# Patient Record
Sex: Female | Born: 1980 | Race: Black or African American | Hispanic: No | Marital: Married | Smoking: Never smoker
Health system: Southern US, Community
[De-identification: ages and names within clinical notes are randomized; demographics above are authoritative.]

## PROBLEM LIST (undated history)

## (undated) ENCOUNTER — Inpatient Hospital Stay (HOSPITAL_COMMUNITY): Payer: Self-pay

## (undated) HISTORY — PX: APPENDECTOMY: SHX54

---

## 2014-03-05 ENCOUNTER — Inpatient Hospital Stay (HOSPITAL_COMMUNITY)
Admission: AD | Admit: 2014-03-05 | Discharge: 2014-03-05 | Discharge status: Against Medical Advice | Payer: Medicaid Other | Source: Ambulatory Visit | Attending: Obstetrics & Gynecology | Admitting: Obstetrics & Gynecology

## 2014-03-05 ENCOUNTER — Encounter (HOSPITAL_COMMUNITY): Payer: Self-pay

## 2014-03-05 DIAGNOSIS — O210 Mild hyperemesis gravidarum: Secondary | ICD-10-CM | POA: Insufficient documentation

## 2014-03-05 DIAGNOSIS — R109 Unspecified abdominal pain: Secondary | ICD-10-CM | POA: Insufficient documentation

## 2014-03-05 DIAGNOSIS — O9989 Other specified diseases and conditions complicating pregnancy, childbirth and the puerperium: Principal | ICD-10-CM

## 2014-03-05 DIAGNOSIS — K5900 Constipation, unspecified: Secondary | ICD-10-CM | POA: Insufficient documentation

## 2014-03-05 DIAGNOSIS — R51 Headache: Secondary | ICD-10-CM | POA: Insufficient documentation

## 2014-03-05 DIAGNOSIS — O99891 Other specified diseases and conditions complicating pregnancy: Secondary | ICD-10-CM | POA: Insufficient documentation

## 2014-03-05 LAB — URINALYSIS, ROUTINE W REFLEX MICROSCOPIC
Bilirubin Urine: NEGATIVE
Glucose, UA: NEGATIVE mg/dL
Hgb urine dipstick: NEGATIVE
KETONES UR: NEGATIVE mg/dL
LEUKOCYTES UA: NEGATIVE
NITRITE: NEGATIVE
PH: 6 (ref 5.0–8.0)
PROTEIN: NEGATIVE mg/dL
Specific Gravity, Urine: 1.015 (ref 1.005–1.030)
Urobilinogen, UA: 0.2 mg/dL (ref 0.0–1.0)

## 2014-03-05 LAB — WET PREP, GENITAL
Clue Cells Wet Prep HPF POC: NONE SEEN
Trich, Wet Prep: NONE SEEN
Yeast Wet Prep HPF POC: NONE SEEN

## 2014-03-05 LAB — COMPREHENSIVE METABOLIC PANEL
ALT: 20 U/L (ref 0–35)
AST: 18 U/L (ref 0–37)
Albumin: 2.9 g/dL — ABNORMAL LOW (ref 3.5–5.2)
Alkaline Phosphatase: 30 U/L — ABNORMAL LOW (ref 39–117)
BUN: 7 mg/dL (ref 6–23)
CHLORIDE: 100 meq/L (ref 96–112)
CO2: 24 meq/L (ref 19–32)
Calcium: 9.6 mg/dL (ref 8.4–10.5)
Creatinine, Ser: 0.55 mg/dL (ref 0.50–1.10)
GFR calc Af Amer: >90 mL/min (ref >90)
Glucose, Bld: 78 mg/dL (ref 70–99)
Potassium: 4.6 mEq/L (ref 3.7–5.3)
Sodium: 134 mEq/L — ABNORMAL LOW (ref 137–147)
Total Protein: 7 g/dL (ref 6.0–8.3)

## 2014-03-05 LAB — CBC
HEMATOCRIT: 35.6 % — AB (ref 36.0–46.0)
HEMOGLOBIN: 12.3 g/dL (ref 12.0–15.0)
MCH: 32.4 pg (ref 26.0–34.0)
MCHC: 34.6 g/dL (ref 30.0–36.0)
MCV: 93.7 fL (ref 78.0–100.0)
Platelets: 152 10*3/uL (ref 150–400)
RBC: 3.8 MIL/uL — AB (ref 3.87–5.11)
RDW: 14.3 % (ref 11.5–15.5)
WBC: 7.6 10*3/uL (ref 4.0–10.5)

## 2014-03-05 LAB — LIPASE, BLOOD: Lipase: 27 U/L (ref 11–59)

## 2014-03-05 LAB — AMYLASE: AMYLASE: 84 U/L (ref 0–105)

## 2014-03-05 MED ORDER — PROMETHAZINE HCL 25 MG PO TABS
25.0000 mg | ORAL_TABLET | Freq: Once | ORAL | Status: AC
  Administered 2014-03-05: 25 mg via ORAL
  Filled 2014-03-05: qty 1

## 2014-03-05 MED ORDER — GI COCKTAIL ~~LOC~~
30.0000 mL | Freq: Once | ORAL | Status: AC
  Administered 2014-03-05: 30 mL via ORAL
  Filled 2014-03-05: qty 30

## 2014-03-05 NOTE — MAU Note (Signed)
Patient states she has not had prenatal care. Went to a general [facility] and was told she was pregnant and her due date is 10-28 by her LMP. Has been having abdominal pain off and on but has been constant since this am. Denies bleeding or discharge. States she has felt fetal movement.

## 2014-03-05 NOTE — MAU Provider Note (Signed)
History     CSN: [id number]  Arrival date and time: 03/05/14 1654   First Provider Initiated Contact with Patient 03/05/14 1855      Chief Complaint  Patient presents with  . Abdominal Pain   Abdominal Pain Associated symptoms include constipation, headaches (one headache today), nausea and vomiting. Pertinent negatives include no diarrhea, dysuria or fever.   This is a 33 y.o. female at [redacted]w[redacted]d who presents with c/o abdominal pain for weeks, worsened today. Pain is all over abdomen then focuses on suprapubic area. No leaking or bleeding.  Has vomiting once a day, sometimes has blood in it. C/O constipation, last BM yesterday. Denies fever or diarrhea.   Has appointment in July with Dr Ruthann Cancer for prenatal care.   RN Note: Patient states she has not had prenatal care. Went to a general [facility] and was told she was pregnant and her due date is 10-28 by her LMP. Has been having abdominal pain off and on but has been constant since this am. Denies bleeding or discharge. States she has felt fetal movement.        OB History   Grav Para Term Preterm Abortions TAB SAB Ect Mult Living   2 1 1       1       History reviewed. No pertinent past medical history.  Past Surgical History  Procedure Laterality Date  . Appendectomy      History reviewed. No pertinent family history.  History  Substance Use Topics  . Smoking status: Never Smoker   . Smokeless tobacco: Not on file  . Alcohol Use: No    Allergies: No Known Allergies  Prescriptions prior to admission  Medication Sig Dispense Refill  . acetaminophen (TYLENOL) 500 MG tablet Take 1,000 mg by mouth every 6 (six) hours as needed for moderate pain.      . Prenatal Vit-Fe Fumarate-FA (PRENATAL MULTIVITAMIN) TABS tablet Take 1 tablet by mouth daily at 12 noon.        Review of Systems  Constitutional: Negative for fever and chills.  Gastrointestinal: Positive for nausea, vomiting, abdominal pain and constipation.  Negative for diarrhea.  Genitourinary: Negative for dysuria.  Musculoskeletal: Negative for back pain.  Neurological: Positive for headaches (one headache today). Negative for dizziness.   Physical Exam   Blood pressure 111/71, pulse 86, temperature 99 F (37.2 C), temperature source Oral, resp. rate 16, height 5' 6.5" (1.689 m), weight 100.426 kg (221 lb 6.4 oz), last menstrual period 10/04/2013.  Physical Exam  Constitutional: She is oriented to person, place, and time. She appears well-developed and well-nourished. No distress.  HENT:  Head: Normocephalic.  Cardiovascular: Normal rate.   Respiratory: Effort normal.  GI: Soft. She exhibits no distension and no mass. There is tenderness (mildly tender throughout). There is no rebound and no guarding.  Genitourinary: Vagina normal and uterus normal. No vaginal discharge found.  Cervix long and closed Fundal height about 21cm No cervical motion tenderness  Cultures obtained   Musculoskeletal: Normal range of motion.  Neurological: She is alert and oriented to person, place, and time.  Skin: Skin is warm and dry.  Psychiatric: She has a normal mood and affect.    MAU Course  Procedures  MDM Will check UA, CBC, CMET, amylase and lipase.  Will give GI cocktail and phenergan.   Assessment and Plan  A:  SIUP at [redacted]w[redacted]d        Abdominal pain of unknown origin, likely GI  P:  Report to oncoming shift  [facility] 03/05/2014, 7:04 PM    ADDENDUM:  Went to MAU approx 20:45, after receiving report from Hansel Feinstein, CNM. Was told by nurse patient had decided to leave and follow up outpatient/return to MAU if symptoms worsen/change. Labs reviewed once results available, no concerns.   Emeterio Reeve, DO, Resident Physician

## 2014-03-06 LAB — GC/CHLAMYDIA PROBE AMP
CT Probe RNA: NEGATIVE
GC Probe RNA: NEGATIVE

## 2014-03-14 NOTE — MAU Provider Note (Signed)
Attestation of Attending Supervision of Advanced Practitioner (CNM/NP): Evaluation and management procedures were performed by the Advanced Practitioner under my supervision and collaboration. I have reviewed the Advanced Practitioner's note and chart, and I agree with the management and plan.  LEGGETT,KELLY H. 11:55 AM

## 2014-03-18 ENCOUNTER — Inpatient Hospital Stay (HOSPITAL_COMMUNITY)
Admission: AD | Admit: 2014-03-18 | Discharge: 2014-03-19 | Disposition: A | Discharge status: Home / Self Care | Payer: Medicaid Other | Source: Ambulatory Visit | Attending: Obstetrics | Admitting: Obstetrics

## 2014-03-18 ENCOUNTER — Encounter (HOSPITAL_COMMUNITY): Payer: Self-pay | Admitting: *Deleted

## 2014-03-18 DIAGNOSIS — D259 Leiomyoma of uterus, unspecified: Secondary | ICD-10-CM | POA: Diagnosis not present

## 2014-03-18 DIAGNOSIS — R1032 Left lower quadrant pain: Secondary | ICD-10-CM | POA: Diagnosis present

## 2014-03-18 DIAGNOSIS — O3410 Maternal care for benign tumor of corpus uteri, unspecified trimester: Secondary | ICD-10-CM | POA: Insufficient documentation

## 2014-03-18 DIAGNOSIS — K5900 Constipation, unspecified: Secondary | ICD-10-CM | POA: Diagnosis not present

## 2014-03-18 DIAGNOSIS — O26899 Other specified pregnancy related conditions, unspecified trimester: Secondary | ICD-10-CM

## 2014-03-18 DIAGNOSIS — O99119 Other diseases of the blood and blood-forming organs and certain disorders involving the immune mechanism complicating pregnancy, unspecified trimester: Secondary | ICD-10-CM

## 2014-03-18 DIAGNOSIS — D696 Thrombocytopenia, unspecified: Secondary | ICD-10-CM | POA: Diagnosis present

## 2014-03-18 LAB — WET PREP, GENITAL
Clue Cells Wet Prep HPF POC: NONE SEEN
Trich, Wet Prep: NONE SEEN
Yeast Wet Prep HPF POC: NONE SEEN

## 2014-03-18 LAB — CBC WITH DIFFERENTIAL/PLATELET
Basophils Absolute: 0 10*3/uL (ref 0.0–0.1)
Basophils Relative: 0 % (ref 0–1)
EOS ABS: 0.1 10*3/uL (ref 0.0–0.7)
Eosinophils Relative: 1 % (ref 0–5)
HCT: 35.9 % — ABNORMAL LOW (ref 36.0–46.0)
HEMOGLOBIN: 12.4 g/dL (ref 12.0–15.0)
LYMPHS ABS: 1.5 10*3/uL (ref 0.7–4.0)
LYMPHS PCT: 21 % (ref 12–46)
MCH: 32.5 pg (ref 26.0–34.0)
MCHC: 34.5 g/dL (ref 30.0–36.0)
MCV: 94.2 fL (ref 78.0–100.0)
MONOS PCT: 5 % (ref 3–12)
Monocytes Absolute: 0.3 10*3/uL (ref 0.1–1.0)
NEUTROS ABS: 5.2 10*3/uL (ref 1.7–7.7)
NEUTROS PCT: 73 % (ref 43–77)
Platelets: 130 10*3/uL — ABNORMAL LOW (ref 150–400)
RBC: 3.81 MIL/uL — ABNORMAL LOW (ref 3.87–5.11)
RDW: 14.3 % (ref 11.5–15.5)
WBC: 7.2 10*3/uL (ref 4.0–10.5)

## 2014-03-18 LAB — URINALYSIS, ROUTINE W REFLEX MICROSCOPIC
BILIRUBIN URINE: NEGATIVE
Glucose, UA: NEGATIVE mg/dL
HGB URINE DIPSTICK: NEGATIVE
KETONES UR: NEGATIVE mg/dL
Leukocytes, UA: NEGATIVE
NITRITE: NEGATIVE
PH: 6 (ref 5.0–8.0)
Protein, ur: NEGATIVE mg/dL
Specific Gravity, Urine: <1.005 — ABNORMAL LOW (ref 1.005–1.030)
UROBILINOGEN UA: 0.2 mg/dL (ref 0.0–1.0)

## 2014-03-18 NOTE — MAU Note (Signed)
Pt reports left side pain that radiates to lower abd x 24 hours. Denies bleeding or ROM.

## 2014-03-18 NOTE — MAU Provider Note (Signed)
History     CSN: [id number]  Arrival date and time: 03/18/14 2240   First Provider Initiated Contact with Patient 03/18/14 2329      Chief Complaint  Patient presents with  . Abdominal Pain   HPI This is a 33 y.o. female at [redacted]w[redacted]d who presents with c/o LLQ pain for 24 hrs. Denies bleeding. Denies fever, nausea, vomiting or diarrhea. Does state her BM was "very small" this morning.  Has had appendectomy in past.   RN Note:  Pt reports left side pain that radiates to lower abd x 24 hours. Denies bleeding or ROM.        OB History   Grav Para Term Preterm Abortions TAB SAB Ect Mult Living   2 1 1       1       History reviewed. No pertinent past medical history.  Past Surgical History  Procedure Laterality Date  . Appendectomy      No family history on file.  History  Substance Use Topics  . Smoking status: Never Smoker   . Smokeless tobacco: Not on file  . Alcohol Use: No    Allergies: No Known Allergies  Prescriptions prior to admission  Medication Sig Dispense Refill  . acetaminophen (TYLENOL) 500 MG tablet Take 1,000 mg by mouth every 6 (six) hours as needed for moderate pain.      . Prenatal Vit-Fe Fumarate-FA (PRENATAL MULTIVITAMIN) TABS tablet Take 1 tablet by mouth daily at 12 noon.        Review of Systems  Constitutional: Negative for fever, chills and malaise/fatigue.  Gastrointestinal: Positive for abdominal pain and constipation. Negative for nausea, vomiting and diarrhea.  Genitourinary: Positive for frequency. Negative for dysuria.   Physical Exam   Blood pressure 106/56, pulse 72, temperature 98.6 F (37 C), temperature source Oral, resp. rate 16, height 5' 6.5" (1.689 m), weight 100.245 kg (221 lb), last menstrual period 10/04/2013, SpO2 100.00%.  Physical Exam  Constitutional: She is oriented to person, place, and time. She appears well-developed and well-nourished. No distress.  HENT:  Head: Normocephalic.  Cardiovascular: Normal rate,  regular rhythm and normal heart sounds.   Respiratory: Effort normal and breath sounds normal. No respiratory distress. She has no wheezes. She has no rales.  GI: Soft. She exhibits no distension and no mass. There is tenderness (LLQ). There is guarding. There is no rebound.  Genitourinary: Vagina normal and uterus normal. No vaginal discharge found.  Cervix long and closed  Musculoskeletal: Normal range of motion.  Neurological: She is alert and oriented to person, place, and time.  Skin: Skin is warm and dry.  Psychiatric: She has a normal mood and affect.   FHR reassuring No contractions  MAU Course  Procedures  MDM Results for orders placed during the hospital encounter of 03/18/14 (from the past 72 hour(s))  URINALYSIS, ROUTINE W REFLEX MICROSCOPIC     Status: Abnormal   Collection Time    03/18/14 11:25 PM      Result Value Ref Range   Color, Urine YELLOW  YELLOW   APPearance CLEAR  CLEAR   Specific Gravity, Urine <1.005 (*) 1.005 - 1.030   pH 6.0  5.0 - 8.0   Glucose, UA NEGATIVE  NEGATIVE mg/dL   Hgb urine dipstick NEGATIVE  NEGATIVE   Bilirubin Urine NEGATIVE  NEGATIVE   Ketones, ur NEGATIVE  NEGATIVE mg/dL   Protein, ur NEGATIVE  NEGATIVE mg/dL   Urobilinogen, UA 0.2  0.0 - 1.0 mg/dL  Nitrite NEGATIVE  NEGATIVE   Leukocytes, UA NEGATIVE  NEGATIVE   Comment: MICROSCOPIC NOT DONE ON URINES WITH NEGATIVE PROTEIN, BLOOD, LEUKOCYTES, NITRITE, OR GLUCOSE <1000 mg/dL.  WET PREP, GENITAL     Status: Abnormal   Collection Time    03/18/14 11:40 PM      Result Value Ref Range   Yeast Wet Prep HPF POC NONE SEEN  NONE SEEN   Trich, Wet Prep NONE SEEN  NONE SEEN   Clue Cells Wet Prep HPF POC NONE SEEN  NONE SEEN   WBC, Wet Prep HPF POC FEW (*) NONE SEEN   Comment: MODERATE BACTERIA SEEN  CBC WITH DIFFERENTIAL     Status: Abnormal   Collection Time    03/18/14 11:47 PM      Result Value Ref Range   WBC 7.2  4.0 - 10.5 K/uL   RBC 3.81 (*) 3.87 - 5.11 MIL/uL   Hemoglobin  12.4  12.0 - 15.0 g/dL   HCT 35.9 (*) 36.0 - 46.0 %   MCV 94.2  78.0 - 100.0 fL   MCH 32.5  26.0 - 34.0 pg   MCHC 34.5  30.0 - 36.0 g/dL   RDW 14.3  11.5 - 15.5 %   Platelets 130 (*) 150 - 400 K/uL   Neutrophils Relative % 73  43 - 77 %   Neutro Abs 5.2  1.7 - 7.7 K/uL   Lymphocytes Relative 21  12 - 46 %   Lymphs Abs 1.5  0.7 - 4.0 K/uL   Monocytes Relative 5  3 - 12 %   Monocytes Absolute 0.3  0.1 - 1.0 K/uL   Eosinophils Relative 1  0 - 5 %   Eosinophils Absolute 0.1  0.0 - 0.7 K/uL   Basophils Relative 0  0 - 1 %   Basophils Absolute 0.0  0.0 - 0.1 K/uL   Preliminary [place] report:  two small left fundal fibroids, Left uterine synechiae, Ovaries normal, Cervix 3.5cm long, Placenta Anterior  Enema given with good results. States pain is unchanged. Will give percocet for pain, ?pain from fibroids.  On exam, LLQ seems less tender to palpation, but pt states it hurts the same.  Then states pain is better. Cervix remains Long/Closed (rechecked after [place]).  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       Come back if develops new sx (fever, N/V) or worsening   [facility] 03/18/2014, 11:39 PM

## 2014-03-19 ENCOUNTER — Inpatient Hospital Stay (HOSPITAL_COMMUNITY): Payer: Medicaid Other

## 2014-03-19 DIAGNOSIS — D696 Thrombocytopenia, unspecified: Secondary | ICD-10-CM | POA: Diagnosis present

## 2014-03-19 DIAGNOSIS — R1032 Left lower quadrant pain: Secondary | ICD-10-CM

## 2014-03-19 DIAGNOSIS — O9989 Other specified diseases and conditions complicating pregnancy, childbirth and the puerperium: Secondary | ICD-10-CM

## 2014-03-19 DIAGNOSIS — O99119 Other diseases of the blood and blood-forming organs and certain disorders involving the immune mechanism complicating pregnancy, unspecified trimester: Secondary | ICD-10-CM

## 2014-03-19 MED ORDER — OXYCODONE-ACETAMINOPHEN 5-325 MG PO TABS
2.0000 | ORAL_TABLET | Freq: Once | ORAL | Status: AC
  Administered 2014-03-19: 2 via ORAL
  Filled 2014-03-19: qty 2

## 2014-03-19 MED ORDER — FLEET ENEMA 7-19 GM/118ML RE ENEM
1.0000 | ENEMA | Freq: Once | RECTAL | Status: AC
Start: 2014-03-19 — End: 2014-03-19
  Administered 2014-03-19: 1 via RECTAL

## 2014-03-19 NOTE — Discharge Instructions (Signed)

## 2014-03-27 ENCOUNTER — Encounter: Payer: Self-pay | Admitting: Advanced Practice Midwife

## 2014-03-27 DIAGNOSIS — N856 Intrauterine synechiae: Secondary | ICD-10-CM | POA: Insufficient documentation

## 2014-03-28 ENCOUNTER — Encounter: Payer: Self-pay | Admitting: Advanced Practice Midwife

## 2014-07-17 ENCOUNTER — Encounter (HOSPITAL_COMMUNITY): Payer: Self-pay | Admitting: *Deleted

## 2014-08-14 ENCOUNTER — Inpatient Hospital Stay (HOSPITAL_COMMUNITY): Admission: AD | Admit: 2014-08-14 | Payer: Medicaid Other | Source: Ambulatory Visit | Admitting: Obstetrics

## 2015-01-08 ENCOUNTER — Encounter (HOSPITAL_COMMUNITY): Payer: Self-pay | Admitting: *Deleted
